# Patient Record
Sex: Male | Born: 1995 | Race: Black or African American | Hispanic: No | Marital: Single | Smoking: Never smoker
Health system: Southern US, Community
[De-identification: ages and names within clinical notes are randomized; demographics above are authoritative.]

## PROBLEM LIST (undated history)

## (undated) DIAGNOSIS — L709 Acne, unspecified: Secondary | ICD-10-CM

## (undated) DIAGNOSIS — J309 Allergic rhinitis, unspecified: Secondary | ICD-10-CM

## (undated) HISTORY — DX: Allergic rhinitis, unspecified: J30.9

## (undated) HISTORY — DX: Acne, unspecified: L70.9

---
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
## 2014-05-04 ENCOUNTER — Ambulatory Visit: Payer: BC Managed Care – PPO | Admitting: Internal Medicine

## 2015-01-04 ENCOUNTER — Emergency Department (HOSPITAL_COMMUNITY): Payer: BLUE CROSS/BLUE SHIELD

## 2015-01-04 ENCOUNTER — Emergency Department (HOSPITAL_COMMUNITY)
Admission: EM | Admit: 2015-01-04 | Discharge: 2015-01-04 | Disposition: A | Discharge status: Home / Self Care | Payer: BLUE CROSS/BLUE SHIELD | Attending: Emergency Medicine | Admitting: Emergency Medicine

## 2015-01-04 DIAGNOSIS — Y906 Blood alcohol level of 120-199 mg/100 ml: Secondary | ICD-10-CM | POA: Insufficient documentation

## 2015-01-04 DIAGNOSIS — Y9389 Activity, other specified: Secondary | ICD-10-CM | POA: Insufficient documentation

## 2015-01-04 DIAGNOSIS — R1110 Vomiting, unspecified: Secondary | ICD-10-CM | POA: Insufficient documentation

## 2015-01-04 DIAGNOSIS — Z8709 Personal history of other diseases of the respiratory system: Secondary | ICD-10-CM | POA: Insufficient documentation

## 2015-01-04 DIAGNOSIS — F10120 Alcohol abuse with intoxication, uncomplicated: Secondary | ICD-10-CM | POA: Insufficient documentation

## 2015-01-04 DIAGNOSIS — Z7951 Long term (current) use of inhaled steroids: Secondary | ICD-10-CM | POA: Insufficient documentation

## 2015-01-04 DIAGNOSIS — Z041 Encounter for examination and observation following transport accident: Secondary | ICD-10-CM | POA: Insufficient documentation

## 2015-01-04 DIAGNOSIS — Y92410 Unspecified street and highway as the place of occurrence of the external cause: Secondary | ICD-10-CM | POA: Diagnosis not present

## 2015-01-04 DIAGNOSIS — F10920 Alcohol use, unspecified with intoxication, uncomplicated: Secondary | ICD-10-CM

## 2015-01-04 DIAGNOSIS — Y998 Other external cause status: Secondary | ICD-10-CM | POA: Insufficient documentation

## 2015-01-04 LAB — CBC
HCT: 37.8 % — ABNORMAL LOW (ref 39.0–52.0)
HEMOGLOBIN: 12.7 g/dL — AB (ref 13.0–17.0)
MCH: 26.7 pg (ref 26.0–34.0)
MCHC: 33.6 g/dL (ref 30.0–36.0)
MCV: 79.4 fL (ref 78.0–100.0)
PLATELETS: 257 10*3/uL (ref 150–400)
RBC: 4.76 MIL/uL (ref 4.22–5.81)
RDW: 16.6 % — ABNORMAL HIGH (ref 11.5–15.5)
WBC: 7 10*3/uL (ref 4.0–10.5)

## 2015-01-04 LAB — PROTIME-INR
INR: 1.15 (ref 0.00–1.49)
Prothrombin Time: 14.9 seconds (ref 11.6–15.2)

## 2015-01-04 LAB — COMPREHENSIVE METABOLIC PANEL
ALT: 18 U/L (ref 0–53)
ANION GAP: 9 (ref 5–15)
AST: 44 U/L — ABNORMAL HIGH (ref 0–37)
Albumin: 4.2 g/dL (ref 3.5–5.2)
Alkaline Phosphatase: 149 U/L — ABNORMAL HIGH (ref 39–117)
BILIRUBIN TOTAL: 1 mg/dL (ref 0.3–1.2)
BUN: 10 mg/dL (ref 6–23)
CO2: 24 mmol/L (ref 19–32)
Calcium: 8.6 mg/dL (ref 8.4–10.5)
Chloride: 108 mmol/L (ref 96–112)
Creatinine, Ser: 1.18 mg/dL (ref 0.50–1.35)
GFR calc non Af Amer: 88 mL/min — ABNORMAL LOW (ref >90)
Glucose, Bld: 121 mg/dL — ABNORMAL HIGH (ref 70–99)
POTASSIUM: 3.6 mmol/L (ref 3.5–5.1)
SODIUM: 141 mmol/L (ref 135–145)
Total Protein: 7 g/dL (ref 6.0–8.3)

## 2015-01-04 LAB — SAMPLE TO BLOOD BANK

## 2015-01-04 LAB — ETHANOL: Alcohol, Ethyl (B): 163 mg/dL — ABNORMAL HIGH (ref 0–9)

## 2015-01-04 MED ORDER — SODIUM CHLORIDE 0.9 % IV BOLUS (SEPSIS)
1000.0000 mL | Freq: Once | INTRAVENOUS | Status: AC
  Administered 2015-01-04: 1000 mL via INTRAVENOUS
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
## 2015-01-04 NOTE — ED Notes (Signed)
Pt awake, removed c-collar.  Instructed pt on need to remain in collar and replaced.

## 2015-01-04 NOTE — ED Notes (Signed)
Pt ambulated to the bathroom without any difficulty. Dr. Anitra LauthPlunkett aware.

## 2015-01-04 NOTE — Discharge Instructions (Signed)
Alcohol Intoxication °Alcohol intoxication occurs when you drink enough alcohol that it affects your ability to function. It can be mild or very severe. Drinking a lot of alcohol in a short time is called binge drinking. This can be very harmful. Drinking alcohol can also be more dangerous if you are taking medicines or other drugs. Some of the effects caused by alcohol may include: °· Loss of coordination. °· Changes in mood and behavior. °· Unclear thinking. °· Trouble talking (slurred speech). °· Throwing up (vomiting). °· Confusion. °· Slowed breathing. °· Twitching and shaking (seizures). °· Loss of consciousness. °HOME CARE °· Do not drive after drinking alcohol. °· Drink enough water and fluids to keep your pee (urine) clear or pale yellow. Avoid caffeine. °· Only take medicine as told by your doctor. °GET HELP IF: °· You throw up (vomit) many times. °· You do not feel better after a few days. °· You frequently have alcohol intoxication. Your doctor can help decide if you should see a substance use treatment counselor. °GET HELP RIGHT AWAY IF: °· You become shaky when you stop drinking. °· You have twitching and shaking. °· You throw up blood. It may look bright red or like coffee grounds. °· You notice blood in your poop (bowel movements). °· You become lightheaded or pass out (faint). °MAKE SURE YOU:  °· Understand these instructions. °· Will watch your condition. °· Will get help right away if you are not doing well or get worse. °Document Released: 03/16/2008 Document Revised: 05/31/2013 Document Reviewed: 03/03/2013 °ExitCare® Patient Information ©2015 [employer]. This information is not intended to replace advice given to you by your health care provider. Make sure you discuss any questions you have with your health care provider. ° °

## 2015-01-04 NOTE — ED Provider Notes (Signed)
Patient is now awake alert and oriented. He is able to ambulate without difficulty. C-spine cleared. Patient was discharged home.  Gwyneth SproutWhitney Rockelle Heuerman, MD 01/04/15 [id number]
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
## 2015-04-02 ENCOUNTER — Encounter: Payer: Self-pay | Admitting: Family Medicine

## 2015-04-02 ENCOUNTER — Ambulatory Visit (INDEPENDENT_AMBULATORY_CARE_PROVIDER_SITE_OTHER): Payer: BLUE CROSS/BLUE SHIELD | Admitting: Family Medicine

## 2015-04-02 VITALS — BP 116/70 | HR 68 | Temp 99.0°F | Ht 75.0 in | Wt 172.8 lb

## 2015-04-02 DIAGNOSIS — Z025 Encounter for examination for participation in sport: Secondary | ICD-10-CM

## 2015-04-02 DIAGNOSIS — Z0000 Encounter for general adult medical examination without abnormal findings: Secondary | ICD-10-CM

## 2015-04-02 DIAGNOSIS — L709 Acne, unspecified: Secondary | ICD-10-CM

## 2015-04-02 NOTE — Progress Notes (Signed)
Pre visit review using our clinic review tool, if applicable. No additional management support is needed unless otherwise documented below in the visit note.  Sports physical:  No complaints. Will need form done for band per patient report.  He'll drop it off. No CP, SOB, BLE edema.  No h/o syncope, no h/o cardiac abnormality.  Has participated throughout high school and college w/o troubles.   Routine safety d/w pt, etoh, hearing protection (band), etc.   ROS:  See HPI.  Otherwise negative.  No personal or family history of any disorder that would prevent athletic participation.  Meds, vitals, and allergies reviewed.   GEN: nad, alert and oriented HEENT: mucous membranes moist, tm wnl bilaterally  NECK: supple w/o LA CV: rrr.  no murmur PULM: ctab, no inc wob ABD: soft, +bs EXT: no edema SKIN: no acute rash  CN 2-12 wnl B, S/S/DTR wnl x4  no laxity of shoulders, elbows, knees

## 2015-04-02 NOTE — Patient Instructions (Signed)
Get me the forms from the band as soon as you can (at the latest by Friday) and I'll get them filled out.

## 2015-04-03 ENCOUNTER — Encounter: Payer: Self-pay | Admitting: Family Medicine

## 2015-04-03 DIAGNOSIS — Z0000 Encounter for general adult medical examination without abnormal findings: Secondary | ICD-10-CM | POA: Insufficient documentation

## 2015-04-03 DIAGNOSIS — L709 Acne, unspecified: Secondary | ICD-10-CM | POA: Insufficient documentation

## 2015-04-03 NOTE — Assessment & Plan Note (Signed)
See above, okay to participate.  Will await forms from patient.

## 2015-04-09 ENCOUNTER — Telehealth: Payer: Self-pay | Admitting: Internal Medicine

## 2015-04-09 NOTE — Telephone Encounter (Signed)
Pt dropped off forms that need to be filled out. Pt also stated that he thinks he may need vision exam.  Please call pt when ready to be picked up or let me know if vision screening needs to be scheduled.  Placing on Melanie's Desk, thanks

## 2015-04-09 NOTE — Telephone Encounter (Signed)
Patient's form is on Milanie Rosenfield's desk.  Form is complete except for needing the vision screen.  Patient is coming in on Wednesday to get that done and pick up the form.

## 2015-04-09 NOTE — Telephone Encounter (Signed)
Routed to wrong provider by mistake, sending to lugene

## 2015-04-10 NOTE — Telephone Encounter (Signed)
Taken care of by [place].
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
## 2016-04-21 ENCOUNTER — Ambulatory Visit (INDEPENDENT_AMBULATORY_CARE_PROVIDER_SITE_OTHER): Payer: BLUE CROSS/BLUE SHIELD | Admitting: Family Medicine

## 2016-04-21 ENCOUNTER — Encounter: Payer: Self-pay | Admitting: Family Medicine

## 2016-04-21 ENCOUNTER — Other Ambulatory Visit: Payer: Self-pay | Admitting: *Deleted

## 2016-04-21 VITALS — BP 112/76 | HR 65 | Temp 98.7°F | Wt 169.2 lb

## 2016-04-21 DIAGNOSIS — Z119 Encounter for screening for infectious and parasitic diseases, unspecified: Secondary | ICD-10-CM | POA: Diagnosis not present

## 2016-04-21 NOTE — Progress Notes (Signed)
Pre visit review using our clinic review tool, if applicable. No additional management support is needed unless otherwise documented below in the visit note.  Wants to get STD screening.   No sx, no discharge, pain, rash, etc.  In a new relationship, wanted to get tested first.  D/w pt about safer sex cautions.  Per patient, doing well in school.   Meds, vitals, and allergies reviewed.   ROS: Per HPI unless specifically indicated in ROS section   nad ncat Neck supple, no LA rrr ctab

## 2016-04-21 NOTE — Patient Instructions (Signed)
Go to the lab on the way out.  We'll contact you with your lab report. Take care.  Glad to see you.  

## 2016-04-22 DIAGNOSIS — Z0000 Encounter for general adult medical examination without abnormal findings: Secondary | ICD-10-CM | POA: Insufficient documentation

## 2016-04-22 LAB — RPR

## 2016-04-22 LAB — GC/CHLAMYDIA PROBE AMP
CT PROBE, AMP APTIMA: NOT DETECTED
GC Probe RNA: NOT DETECTED

## 2016-04-22 LAB — HIV ANTIBODY (ROUTINE TESTING W REFLEX): HIV 1&2 Ab, 4th Generation: NONREACTIVE

## 2016-04-22 NOTE — Assessment & Plan Note (Signed)
I encouraged patient re: safety, congratulated him on getting testing, see notes on labs.

## 2016-10-29 DIAGNOSIS — Z23 Encounter for immunization: Secondary | ICD-10-CM | POA: Diagnosis not present

## 2016-12-15 DIAGNOSIS — L700 Acne vulgaris: Secondary | ICD-10-CM | POA: Diagnosis not present

## 2016-12-15 DIAGNOSIS — Z5181 Encounter for therapeutic drug level monitoring: Secondary | ICD-10-CM | POA: Diagnosis not present

## 2016-12-15 DIAGNOSIS — Z23 Encounter for immunization: Secondary | ICD-10-CM | POA: Diagnosis not present

## 2017-04-26 ENCOUNTER — Telehealth: Payer: Self-pay | Admitting: *Deleted

## 2017-04-26 NOTE — Telephone Encounter (Signed)
-----   Message from Joaquim NamGraham S Duncan, MD sent at 04/26/2017 12:15 AM EDT ----- Regarding: call pt. doesn't need fasting labs prior to the visit.  We can do any labs if needed at the OV.  Thanks.  Clelia CroftShaw  ----- Message ----- From: Baldomero Lamyhavers, Nithya Meriweather C Sent: 04/21/2017   5:15 PM To: Joaquim NamGraham S Duncan, MD Subject: Cpx labs Tues 7/17, need orders. Thanks! :-)   Please order  future cpx labs for pt's upcoming lab appt. Thanks Rodney Boozeasha

## 2017-04-26 NOTE — Telephone Encounter (Signed)
Message left on home and cell # as authorized per DPR that I've canceled lab appt, and for pt to arrive fasting for cpx, that any labs will be done at cpx appt.

## 2017-04-27 ENCOUNTER — Other Ambulatory Visit: Payer: BLUE CROSS/BLUE SHIELD

## 2017-05-03 ENCOUNTER — Ambulatory Visit (INDEPENDENT_AMBULATORY_CARE_PROVIDER_SITE_OTHER): Payer: BLUE CROSS/BLUE SHIELD | Admitting: Family Medicine

## 2017-05-03 ENCOUNTER — Encounter: Payer: Self-pay | Admitting: Family Medicine

## 2017-05-03 VITALS — BP 116/76 | HR 57 | Temp 98.1°F | Ht 75.0 in | Wt 172.0 lb

## 2017-05-03 DIAGNOSIS — Z0000 Encounter for general adult medical examination without abnormal findings: Secondary | ICD-10-CM | POA: Diagnosis not present

## 2017-05-03 DIAGNOSIS — Z23 Encounter for immunization: Secondary | ICD-10-CM

## 2017-05-03 DIAGNOSIS — Z7189 Other specified counseling: Secondary | ICD-10-CM

## 2017-05-03 NOTE — Addendum Note (Signed)
Addended by: Annamarie MajorFUQUAY, LUGENE S on: 05/03/2017 09:52 AM   Modules accepted: Orders
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
## 2017-05-03 NOTE — Progress Notes (Signed)
CPE- See plan.  Routine anticipatory guidance given to patient.  See health maintenance.  The possibility exists that previously documented standard health maintenance information may have been brought forward from a previous encounter into this note.  If needed, that same information has been updated to reflect the current situation based on today's encounter.    Tetanus 2018 Flu shot prev done, encouraged for fall.   PNA and shingles not due, d/w  Pt Colon and prostate cancer screening  not due, d/w  Pt Living will d/w pt.  Mother and father designated if patient were incapacitated.   Safety d/w pt.   STD screening declined, d/w pt about safer sex.   Diet and exercise discussed.    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    PMH and SH reviewed  Meds, vitals, and allergies reviewed.   ROS: Per HPI.  Unless specifically indicated otherwise in HPI, the patient denies:  General: fever. Eyes: acute vision changes ENT: sore throat Cardiovascular: chest pain Respiratory: SOB GI: vomiting GU: dysuria Musculoskeletal: acute back pain Derm: acute rash Neuro: acute motor dysfunction Psych: worsening mood Endocrine: polydipsia Heme: bleeding Allergy: hayfever  GEN: nad, alert and oriented HEENT: mucous membranes moist NECK: supple w/o LA CV: rrr. PULM: ctab, no inc wob ABD: soft, +bs EXT: no edema SKIN: no acute rash

## 2017-05-03 NOTE — Assessment & Plan Note (Signed)
Tetanus 2018 Flu shot prev done, encouraged for fall.   PNA and shingles not due, d/w  Pt Colon and prostate cancer screening  not due, d/w  Pt Living will d/w pt.  Mother and father designated if patient were incapacitated.   Safety d/w pt.   STD screening declined, d/w pt about safer sex.   Diet and exercise discussed.

## 2017-05-03 NOTE — Patient Instructions (Addendum)
I would get a flu shot each fall.   Take care.  Glad to see you.  Update me as needed.  

## 2017-05-21 ENCOUNTER — Ambulatory Visit (INDEPENDENT_AMBULATORY_CARE_PROVIDER_SITE_OTHER): Payer: BLUE CROSS/BLUE SHIELD | Admitting: Family Medicine

## 2017-05-21 ENCOUNTER — Encounter: Payer: Self-pay | Admitting: Family Medicine

## 2017-05-21 DIAGNOSIS — R519 Headache, unspecified: Secondary | ICD-10-CM

## 2017-05-21 DIAGNOSIS — R51 Headache: Secondary | ICD-10-CM | POA: Diagnosis not present
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
## 2017-05-21 NOTE — Assessment & Plan Note (Signed)
Likely from neck muscle tightness, d/w pt about stretching, ibuprofen, pillow change, heat.  Update me as needed.  Wax in canal likely a separate issue, resolved with irritation.

## 2017-05-21 NOTE — Patient Instructions (Addendum)
Try a different pillow with ibuprofen and heat as needed for your neck.  Take care.  Glad to see you.  Update me as needed.

## 2017-10-09 DIAGNOSIS — Z23 Encounter for immunization: Secondary | ICD-10-CM | POA: Diagnosis not present

## 2018-08-25 ENCOUNTER — Ambulatory Visit: Payer: Self-pay | Admitting: Family Medicine

## 2018-08-25 ENCOUNTER — Encounter: Payer: Self-pay | Admitting: Family Medicine

## 2018-08-25 VITALS — BP 132/80 | HR 88 | Temp 98.2°F | Ht 75.0 in | Wt 174.0 lb

## 2018-08-25 DIAGNOSIS — R51 Headache: Secondary | ICD-10-CM | POA: Diagnosis not present

## 2018-08-25 DIAGNOSIS — R519 Headache, unspecified: Secondary | ICD-10-CM

## 2018-08-25 MED ORDER — METHOCARBAMOL 500 MG PO TABS: 500.0000 mg | ORAL_TABLET | Freq: Three times a day (TID) | ORAL | 1 refills | Status: DC | PRN

## 2018-08-25 NOTE — Patient Instructions (Signed)
Keep drinking water  Regular stretching will help  For a headache/neck pain episode- use heat 10 minutes at a time/ stretch neck/ ibuprofen (with food), drink fluids and try the methocarbamol (muscle relaxer) - caution of sedation   Continue to avoid caffeine  Think about regular exercise also  Try to get enough sleep   Update us if not improving soon/ if the medicine does not help   I will check in with Dr Para Marchuncan as well

## 2018-08-25 NOTE — Assessment & Plan Note (Signed)
Recurrent -most recent one lasting longer  Seems to stem from muscle spasm in L lower neck and move forward (to top of head/forehead but mostly the occiput) Disc poss of tension HA transforming to migraine (no other neuro symptoms) - but night time and early am occurrence support that  Also less likely occipital neuralgia Good sleep and lifestyle habits overall- handout given  Px methocarbamol to try for spasm  Also -enc to continue ibuprofen/ supportive pillow/heat  Will d/w PCP

## 2018-08-25 NOTE — Progress Notes (Signed)
Subjective:    Patient ID: Christian Harrell, male    DOB: 17-May-1996, 22 y.o.   MRN: [id number]  HPI Here for c/o of headache with neck pain   Wt Readings from Last 3 Encounters:  08/25/18 174 lb (78.9 kg)  05/21/17 171 lb 12 oz (77.9 kg)  05/03/17 172 lb (78 kg)   21.75 kg/m   This is worse than last visit   Wakes up in the middle of the night or am (wakes him up)  occ happens at work but less often   Right now -improved   Starts in L side of neck - tight feeling -then pain moves its way up to the head -to his forehead (back of skull hurts the worst)  Feels like a throbbing pain  Worst of it lasts 10-15 min - this am was different   1-2 hours  Pain scale- up to a 7-8/10 when it is worst  No nausea/dizziness or blurred vision   No fever  No recent illness  No head trauma  Stress level is not too bad  Not working out  Has inc water since headaches worsened  Avoids caffeine   sympt treatment- usually takes 2 advil  This am used a heating pad on neck and it helped a bit  Has a good pillow- well supported  No scalp tenderness   Neck otherwise does not bother him   Right now -just a little dull pain at base of skull   No hx of migraine in his family   Works 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 Headache 05/21/2017  . Advance care planning 05/03/2017  . Routine general medical examination at a health care facility 04/22/2016   Past Medical History:  Diagnosis Date  . Acne   . Allergic rhinitis 06/13/2013   Usually spring. OTC allergy meds for relief   History reviewed. No pertinent surgical history. Social History   Tobacco Use  . Smoking status: Never Smoker  . Smokeless tobacco: Never Used   Substance Use Topics  . Alcohol use: Yes    Comment: occ  . Drug use: No   Family History  Problem Relation Age of Onset  . Breast cancer Mother   . Hypertension Mother   . Stroke Maternal Grandmother    No Known Allergies Current Outpatient Medications on File Prior to Visit  Medication Sig Dispense Refill  . fluticasone (FLONASE) 50 MCG/ACT nasal spray Place 2 sprays into the nose daily. For the next month 16 g 1   No current facility-administered medications on file prior to visit.      Review of Systems  Constitutional: Negative for activity change, appetite change, fatigue, fever and unexpected weight change.  HENT: Negative for congestion, rhinorrhea, sore throat and trouble swallowing.   Eyes: Negative for pain, redness, itching and visual disturbance.  Respiratory: Negative for cough, chest tightness, shortness of breath and wheezing.   Cardiovascular: Negative for chest pain and palpitations.  Gastrointestinal: Negative for abdominal pain, blood in stool, constipation, diarrhea and nausea.  Endocrine: Negative for cold intolerance, heat intolerance, polydipsia and polyuria.  Genitourinary: Negative for difficulty urinating, dysuria, frequency and urgency.  Musculoskeletal: Positive for neck pain.  Negative for arthralgias, joint swelling and myalgias.  Skin: Negative for pallor and rash.  Neurological: Positive for headaches. Negative for dizziness, seizures, syncope, facial asymmetry, speech difficulty, weakness, light-headedness and numbness.  Hematological: Negative for adenopathy. Does not bruise/bleed easily.  Psychiatric/Behavioral: Negative for decreased concentration and dysphoric mood. The patient is not nervous/anxious.        Objective:   Physical Exam  Constitutional: He is oriented to person, place, and time. He appears well-developed and well-nourished. No distress.  Well appearing   HENT:  Head: Normocephalic and atraumatic.  Right Ear: External ear  normal.  Left Ear: External ear normal.  Nose: Nose normal.  Mouth/Throat: Oropharynx is clear and moist. No oropharyngeal exudate.  No sinus tenderness No temporal tenderness  No TMJ tenderness  Eyes: Pupils are equal, round, and reactive to light. Conjunctivae and EOM are normal. Right eye exhibits no discharge. Left eye exhibits no discharge. No scleral icterus.  No nystagmus  Neck: Normal range of motion and full passive range of motion without pain. Neck supple. No JVD present. Carotid bruit is not present. No tracheal deviation present. No thyromegaly present.  Tender L cervical and trapezius musculature / spasm is palpable  Mild tenderness of occiput w/o swelling or skin change   Cardiovascular: Normal rate, regular rhythm and normal heart sounds.  No murmur heard. Pulmonary/Chest: Effort normal and breath sounds normal. No respiratory distress. He has no wheezes. He has no rales.  Abdominal: Soft. Bowel sounds are normal. He exhibits no distension and no mass. There is no tenderness.  Musculoskeletal: He exhibits no edema or tenderness.  Lymphadenopathy:    He has no cervical adenopathy.  Neurological: He is alert and oriented to person, place, and time. He has normal strength and normal reflexes. He displays no atrophy, no tremor and normal reflexes. No cranial nerve deficit or sensory deficit. He exhibits normal muscle tone. He displays a negative Romberg sign. Coordination and gait normal.  No focal cerebellar signs   Skin: Skin is warm and dry. No rash noted. No pallor.  Psychiatric: He has a normal mood and affect. His behavior is normal. Thought content normal.  Pleasant / good mood           Assessment & Plan:   Problem List Items Addressed This Visit      Other   Headache - Primary    Recurrent -most recent one lasting longer  Seems to stem from muscle spasm in L lower neck and move forward (to top of head/forehead but mostly the occiput) Disc poss of tension HA  transforming to migraine (no other neuro symptoms) - but night time and early am occurrence support that  Also less likely occipital neuralgia Good sleep and lifestyle habits overall- handout given  Px methocarbamol to try for spasm  Also -enc to continue ibuprofen/ supportive pillow/heat  Will d/w PCP      Relevant Medications   methocarbamol (ROBAXIN) 500 MG tablet

## 2018-09-05 ENCOUNTER — Telehealth: Payer: Self-pay | Admitting: *Deleted

## 2018-09-05 NOTE — Telephone Encounter (Signed)
-----   Message from Joaquim NamGraham S Duncan, MD sent at 08/29/2018 12:08 AM EST -----  Check on patient later this week.  If the headaches are not getting better, then he needs f/u here.  Thanks.   Clelia CroftShaw   ----- Message ----- From: Joaquim Namuncan, Graham S, MD Sent: 08/29/2018 To: Joaquim NamGraham S Duncan, MD  Check on patient later this week.  If the headaches are not getting better, then he needs f/u here.  Thanks.  Clelia CroftShaw

## 2018-09-05 NOTE — Telephone Encounter (Signed)
Spoke with patient's Mom (Amye) who says the patient is not available and states that he has had maybe 2 more headaches since the OV here.  Mom was advised to tell the patient if the headaches are not getting better, to make an appointment for follow up.  Mom agrees.

## 2019-07-31 ENCOUNTER — Encounter: Payer: Self-pay | Admitting: Family Medicine

## 2019-07-31 ENCOUNTER — Other Ambulatory Visit: Payer: Self-pay

## 2019-07-31 ENCOUNTER — Ambulatory Visit (INDEPENDENT_AMBULATORY_CARE_PROVIDER_SITE_OTHER): Payer: BC Managed Care – PPO | Admitting: Family Medicine

## 2019-07-31 VITALS — BP 124/78 | HR 72 | Temp 97.6°F | Ht 75.0 in | Wt 169.5 lb

## 2019-07-31 DIAGNOSIS — Z0000 Encounter for general adult medical examination without abnormal findings: Secondary | ICD-10-CM | POA: Diagnosis not present

## 2019-07-31 DIAGNOSIS — Z7189 Other specified counseling: Secondary | ICD-10-CM

## 2019-07-31 NOTE — Progress Notes (Signed)
CPE- See plan.  Routine anticipatory guidance given to patient.  See health maintenance.  The possibility exists that previously documented standard health maintenance information may have been brought forward from a previous encounter into this note.  If needed, that same information has been updated to reflect the current situation based on today's encounter.    Tetanus 2018 Flu shot 2020 PNA and shingles not due, d/w Pt Colon and prostate cancer screening  not due, d/w  Pt Living will d/w pt.  Mother and Father designated if patient were incapacitated.   Safety d/w pt.   Diet and exercise discussed.    H/o tonsil stones noted episodically but not now.  D/w pt.    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  Graduated from college last May.    PMH and SH reviewed  Meds, vitals, and allergies reviewed.   ROS: Per HPI.  Unless specifically indicated otherwise in HPI, the patient denies:  General: fever. Eyes: acute vision changes ENT: sore throat Cardiovascular: chest pain Respiratory: SOB GI: vomiting GU: dysuria Musculoskeletal: acute back pain Derm: acute rash Neuro: acute motor dysfunction Psych: worsening mood Endocrine: polydipsia Heme: bleeding Allergy: hayfever  GEN: nad, alert and oriented HEENT: ncat, TM wnl B, OP wnl, no tonsil stones noted.  NECK: supple w/o LA CV: rrr. PULM: ctab, no inc wob ABD: soft, +bs EXT: no edema SKIN: no acute rash  Flu shot done at OV.

## 2019-07-31 NOTE — Assessment & Plan Note (Signed)
Living will d/w pt. Mother and Father designated if patient were incapacitated.  

## 2019-07-31 NOTE — Assessment & Plan Note (Signed)
Tetanus 2018 Flu shot 2020 PNA and shingles not due, d/w Pt Colon and prostate cancer screening  not due, d/w  Pt Living will d/w pt.  Mother and Father designated if patient were incapacitated.   Safety d/w pt.   Diet and exercise discussed.   H/o tonsil stones episodically.  D/w pt about gargling prn.  Update me as needed.

## 2019-07-31 NOTE — Patient Instructions (Signed)
Thanks for getting a flu shot done.  Keep working on diet and exercise.  I am glad work is going well.  Update me as needed.  Gargle with warm salt water as needed for tonsil stones.  Take care.  Glad to see you.

## 2020-08-01 ENCOUNTER — Ambulatory Visit (INDEPENDENT_AMBULATORY_CARE_PROVIDER_SITE_OTHER): Payer: BC Managed Care – PPO | Admitting: Family Medicine

## 2020-08-01 ENCOUNTER — Other Ambulatory Visit: Payer: Self-pay

## 2020-08-01 ENCOUNTER — Encounter: Payer: Self-pay | Admitting: Family Medicine

## 2020-08-01 VITALS — BP 120/78 | HR 74 | Temp 97.6°F | Ht 75.0 in | Wt 187.1 lb

## 2020-08-01 DIAGNOSIS — R739 Hyperglycemia, unspecified: Secondary | ICD-10-CM

## 2020-08-01 DIAGNOSIS — Z23 Encounter for immunization: Secondary | ICD-10-CM

## 2020-08-01 DIAGNOSIS — Z0000 Encounter for general adult medical examination without abnormal findings: Secondary | ICD-10-CM

## 2020-08-01 DIAGNOSIS — Z131 Encounter for screening for diabetes mellitus: Secondary | ICD-10-CM | POA: Insufficient documentation

## 2020-08-01 DIAGNOSIS — Z7189 Other specified counseling: Secondary | ICD-10-CM

## 2020-08-01 LAB — POCT GLYCOSYLATED HEMOGLOBIN (HGB A1C): Hemoglobin A1C: 5.2 % (ref 4.0–5.6)

## 2020-08-01 LAB — POCT CBG (FASTING - GLUCOSE)-MANUAL ENTRY: Glucose Fasting, POC: 105 mg/dL — AB (ref 70–99)

## 2020-08-01 NOTE — Progress Notes (Signed)
This visit occurred during the SARS-CoV-2 public health emergency.  Safety protocols were in place, including screening questions prior to the visit, additional usage of staff PPE, and extensive cleaning of exam room while observing appropriate contact time as indicated for disinfecting solutions.  CPE- See plan.  Routine anticipatory guidance given to patient.  See health maintenance.  The possibility exists that previously documented standard health maintenance information may have been brought forward from a previous encounter into this note.  If needed, that same information has been updated to reflect the current situation based on today's encounter.    Tetanus 2018 Flu shot 2021 PNA and shingles not due, d/w Pt Colon and prostate cancer screening not due, d/w Pt Living will d/w pt. Mother and Father designated if patient were incapacitated.  Safety d/w pt.  Diet and exercise discussed. Minimal increase in glucose noted.  Seen in follow-up A1c.  Work is going well.  Discussed.  PMH and SH reviewed  Meds, vitals, and allergies reviewed.   ROS: Per HPI.  Unless specifically indicated otherwise in HPI, the patient denies:  General: fever. Eyes: acute vision changes ENT: sore throat Cardiovascular: chest pain Respiratory: SOB GI: vomiting GU: dysuria Musculoskeletal: acute back pain Derm: acute rash Neuro: acute motor dysfunction Psych: worsening mood Endocrine: polydipsia Heme: bleeding Allergy: hayfever  GEN: nad, alert and oriented HEENT: ncat NECK: supple w/o LA CV: rrr. PULM: ctab, no inc wob ABD: soft, +bs EXT: no edema SKIN: no acute rash

## 2020-08-01 NOTE — Patient Instructions (Addendum)
Take care.  Glad to see you. Please update me as needed.

## 2020-08-04 NOTE — Assessment & Plan Note (Signed)
Living will d/w pt. Mother and Father designated if patient were incapacitated.

## 2020-08-04 NOTE — Assessment & Plan Note (Signed)
°  Tetanus 2018 Flu shot 2021 PNA and shingles not due, d/w Pt Colon and prostate cancer screening not due, d/w Pt Living will d/w pt. Mother and Father designated if patient were incapacitated.  Safety d/w pt.  Diet and exercise discussed. Minimal increase in glucose noted.  Seen in follow-up A1c.

## 2020-12-18 ENCOUNTER — Other Ambulatory Visit: Payer: Self-pay

## 2020-12-18 ENCOUNTER — Encounter: Payer: Self-pay | Admitting: Family Medicine

## 2020-12-18 ENCOUNTER — Ambulatory Visit: Payer: BC Managed Care – PPO | Admitting: Family Medicine

## 2020-12-18 VITALS — BP 118/62 | HR 79 | Temp 98.8°F | Ht 75.0 in | Wt 192.5 lb

## 2020-12-18 DIAGNOSIS — R3915 Urgency of urination: Secondary | ICD-10-CM

## 2020-12-18 LAB — POC URINALSYSI DIPSTICK (AUTOMATED)
Bilirubin, UA: NEGATIVE
Blood, UA: NEGATIVE
Glucose, UA: NEGATIVE
Ketones, UA: NEGATIVE
Leukocytes, UA: NEGATIVE
Nitrite, UA: NEGATIVE
Protein, UA: NEGATIVE
Spec Grav, UA: 1.02 (ref 1.010–1.025)
Urobilinogen, UA: 0.2 E.U./dL
pH, UA: 6 (ref 5.0–8.0)

## 2020-12-18 MED ORDER — SULFAMETHOXAZOLE-TRIMETHOPRIM 800-160 MG PO TABS: 1.0000 | ORAL_TABLET | Freq: Two times a day (BID) | ORAL | 0 refills | Status: AC

## 2020-12-18 NOTE — Progress Notes (Signed)
    Noal Abshier T. Nikko Goldwire, MD, CAQ Sports Medicine  Primary Care and Sports Medicine [facility] [address] [place], [address]  Phone: [phone]  FAX: [phone]  Christian Harrell - 25 y.o. male  MRN [id number]  Date of Birth: Jan 31, 1996  Date: 12/18/2020  PCP: Joaquim Nam, MD  Referral: Joaquim Nam, MD  Chief Complaint  Patient presents with  . Urinary Urgency    This visit occurred during the SARS-CoV-2 public health emergency.  Safety protocols were in place, including screening questions prior to the visit, additional usage of staff PPE, and extensive cleaning of exam room while observing appropriate contact time as indicated for disinfecting solutions.   Subjective:   Christian Harrell is a 25 y.o. very pleasant male patient with Body mass index is 24.06 kg/m. who presents with the following:  He is here with a question of possible UTI:  Felt tight in the stomach. No STD exposure at all and he has a steady partner  Eating ok and drinking ok Has been going on for a few weeks.  BM are normal  Hypogastric fullness  Review of Systems is noted in the HPI, as appropriate  Objective:   BP 118/62   Pulse 79   Temp 98.8 F (37.1 C) (Temporal)   Ht 6\' 3"  (1.905 m)   Wt 192 lb 8 oz (87.3 kg)   SpO2 97%   BMI 24.06 kg/m   GEN: No acute distress; alert,appropriate. PULM: Breathing comfortably in no respiratory distress PSYCH: Normally interactive.  ABD: S, NT, ND, + BS, No rebound, No HSM  GU: normal male, no rash or ulcer, nt testicles  Laboratory and Imaging Data: Results for orders placed or performed in visit on 12/18/20  POCT Urinalysis Dipstick (Automated)  Result Value Ref Range   Color, UA Yellow    Clarity, UA Clear    Glucose, UA Negative Negative   Bilirubin, UA Negative    Ketones, UA Negative    Spec Grav, UA 1.020 1.010 - 1.025   Blood, UA Negative    pH, UA 6.0 5.0 - 8.0   Protein, UA  Negative Negative   Urobilinogen, UA 0.2 0.2 or 1.0 E.U./dL   Nitrite, UA Negative    Leukocytes, UA Negative Negative     Assessment and Plan:     ICD-10-CM   1. Urinary urgency  R39.15 POCT Urinalysis Dipstick (Automated)   UA clear. Urethritis, nongonnococal? UTI with normal UA.  Will treat and await culture  Meds ordered this encounter  Medications  . sulfamethoxazole-trimethoprim (BACTRIM DS) 800-160 MG tablet    Sig: Take 1 tablet by mouth 2 (two) times daily for 7 days.    Dispense:  14 tablet    Refill:  0   There are no discontinued medications. Orders Placed This Encounter  Procedures  . POCT Urinalysis Dipstick (Automated)    Follow-up: No follow-ups on file.  Signed,  02/17/21. Dayden Viverette, MD   Outpatient Encounter Medications as of 12/18/2020  Medication Sig  . fluticasone (FLONASE) 50 MCG/ACT nasal spray Place 2 sprays into the nose daily. For the next month  . sulfamethoxazole-trimethoprim (BACTRIM DS) 800-160 MG tablet Take 1 tablet by mouth 2 (two) times daily for 7 days.   No facility-administered encounter medications on file as of 12/18/2020.

## 2020-12-18 NOTE — Addendum Note (Signed)
Addended by: Damita Lack on: 12/18/2020 02:29 PM   Modules accepted: Orders

## 2020-12-19 LAB — URINE CULTURE
MICRO NUMBER:: 11626918
Result:: NO GROWTH
SPECIMEN QUALITY:: ADEQUATE

## 2021-04-29 ENCOUNTER — Telehealth: Payer: Self-pay | Admitting: Family Medicine

## 2021-04-29 DIAGNOSIS — R519 Headache, unspecified: Secondary | ICD-10-CM

## 2021-04-29 NOTE — Telephone Encounter (Signed)
Mr. Nurse called in wanted to know if Dr. Para March can write a referral for neurology and the location is in [place]. Its for migraines  [facility] Address: [address], [place], [place] [address] Phone: [phone]

## 2021-04-30 NOTE — Telephone Encounter (Signed)
Patient aware referral was done. Patient is not having any new or emergent neurologic sx at this time.

## 2021-04-30 NOTE — Telephone Encounter (Signed)
I put in the referral.  Please check with patient to make sure he does not have any new/emergent neurologic symptoms.  Thanks.

## 2021-04-30 NOTE — Addendum Note (Signed)
Addended by: Joaquim Nam on: 04/30/2021 10:00 AM   Modules accepted: Orders

## 2021-06-05 DIAGNOSIS — G43709 Chronic migraine without aura, not intractable, without status migrainosus: Secondary | ICD-10-CM | POA: Diagnosis not present

## 2021-07-13 ENCOUNTER — Other Ambulatory Visit: Payer: Self-pay | Admitting: Family Medicine

## 2021-07-13 DIAGNOSIS — R739 Hyperglycemia, unspecified: Secondary | ICD-10-CM

## 2021-07-28 ENCOUNTER — Other Ambulatory Visit: Payer: BC Managed Care – PPO

## 2021-08-03 ENCOUNTER — Telehealth: Payer: Self-pay | Admitting: Family Medicine

## 2021-08-03 NOTE — Telephone Encounter (Signed)
Called patient's mother and discussed that I would be out of clinic on Monday due to an illness.  Please contact patient and reschedule when possible.  Thanks.

## 2021-08-04 ENCOUNTER — Encounter: Payer: BC Managed Care – PPO | Admitting: Family Medicine

## 2021-08-05 NOTE — Telephone Encounter (Signed)
[email or web address]

## 2021-08-13 DIAGNOSIS — G43709 Chronic migraine without aura, not intractable, without status migrainosus: Secondary | ICD-10-CM | POA: Diagnosis not present

## 2021-08-22 ENCOUNTER — Encounter: Payer: Self-pay | Admitting: Family Medicine

## 2021-08-22 ENCOUNTER — Ambulatory Visit (INDEPENDENT_AMBULATORY_CARE_PROVIDER_SITE_OTHER): Payer: BC Managed Care – PPO | Admitting: Family Medicine

## 2021-08-22 ENCOUNTER — Other Ambulatory Visit: Payer: Self-pay

## 2021-08-22 VITALS — BP 128/80 | HR 82 | Temp 98.0°F | Ht 75.0 in | Wt 188.0 lb

## 2021-08-22 DIAGNOSIS — Z0000 Encounter for general adult medical examination without abnormal findings: Secondary | ICD-10-CM | POA: Diagnosis not present

## 2021-08-22 DIAGNOSIS — R739 Hyperglycemia, unspecified: Secondary | ICD-10-CM | POA: Diagnosis not present

## 2021-08-22 DIAGNOSIS — Z23 Encounter for immunization: Secondary | ICD-10-CM | POA: Diagnosis not present

## 2021-08-22 DIAGNOSIS — Z7189 Other specified counseling: Secondary | ICD-10-CM

## 2021-08-22 DIAGNOSIS — R300 Dysuria: Secondary | ICD-10-CM

## 2021-08-22 LAB — BASIC METABOLIC PANEL
BUN: 14 mg/dL (ref 6–23)
CO2: 31 mEq/L (ref 19–32)
Calcium: 9.7 mg/dL (ref 8.4–10.5)
Chloride: 103 mEq/L (ref 96–112)
Creatinine, Ser: 1.15 mg/dL (ref 0.40–1.50)
GFR: 88.1 mL/min (ref >60.00)
Glucose, Bld: 89 mg/dL (ref 70–99)
Potassium: 4.3 mEq/L (ref 3.5–5.1)
Sodium: 139 mEq/L (ref 135–145)

## 2021-08-22 LAB — HEMOGLOBIN A1C: Hgb A1c MFr Bld: 5.6 % (ref 4.6–6.5)

## 2021-08-22 MED ORDER — FLUTICASONE PROPIONATE 50 MCG/ACT NA SUSP: 2.0000 | Freq: Every day | NASAL | Status: AC | PRN

## 2021-08-22 NOTE — Patient Instructions (Signed)
Thanks for getting a flu shot.  Keep working on diet and exercise.  Take care.  Glad to see you. Go to the lab on the way out.   If you have mychart we'll likely use that to update you.

## 2021-08-22 NOTE — Progress Notes (Signed)
This visit occurred during the SARS-CoV-2 public health emergency.  Safety protocols were in place, including screening questions prior to the visit, additional usage of staff PPE, and extensive cleaning of exam room while observing appropriate contact time as indicated for disinfecting solutions.  CPE- See plan.  Routine anticipatory guidance given to patient.  See health maintenance.  The possibility exists that previously documented standard health maintenance information may have been brought forward from a previous encounter into this note.  If needed, that same information has been updated to reflect the current situation based on today's encounter.    Tetanus 2018 Flu shot 2022 PNA and shingles not due, d/w pt Covid vaccine prev done.  Colon and prostate cancer screening  not due, d/w pt.  Living will d/w pt.  Mother and father designated if patient were incapacitated.   Diet and exercise discussed.  Working out at home.    He has episodic urinary urgency.  No burning with urination.  Not having symptoms every day.  No fevers, no chills.  No testile pain.  No discharge.  Drinks some caffeine, red bull, usually ~1 per day.  D/w pt about trying to taper caffeine and then update me is sx continue.  Prev u/s and ucx neg.  No change with abx prev.   D/w pt about rechecking sugar, see notes on labs.    H/o seasonal allergies, used flonase prn.  Usually with springtime sx.    Busy with work, enjoying that.    PMH and SH reviewed  Meds, vitals, and allergies reviewed.   ROS: Per HPI.  Unless specifically indicated otherwise in HPI, the patient denies:  General: fever. Eyes: acute vision changes ENT: sore throat Cardiovascular: chest pain Respiratory: SOB GI: vomiting GU: dysuria Musculoskeletal: acute back pain Derm: acute rash Neuro: acute motor dysfunction Psych: worsening mood Endocrine: polydipsia Heme: bleeding Allergy: hayfever  GEN: nad, alert and  oriented HEENT:ncat NECK: supple w/o LA CV: rrr. PULM: ctab, no inc wob ABD: soft, +bs, nontender to palpation. EXT: no edema SKIN: no acute rash

## 2021-08-24 DIAGNOSIS — R300 Dysuria: Secondary | ICD-10-CM | POA: Insufficient documentation

## 2021-08-24 NOTE — Assessment & Plan Note (Signed)
Tetanus 2018 Flu shot 2022 PNA and shingles not due, d/w pt Covid vaccine prev done.  Colon and prostate cancer screening  not due, d/w pt.  Living will d/w pt.  Mother and father designated if patient were incapacitated.   Diet and exercise discussed.  Working out at home.

## 2021-08-24 NOTE — Assessment & Plan Note (Signed)
Discussed with patient about cutting back on caffeine and then letting me know if his symptoms continue.  He agrees with plan.
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
## 2022-08-25 ENCOUNTER — Encounter: Payer: BC Managed Care – PPO | Admitting: Family Medicine

## 2022-08-31 DIAGNOSIS — G43709 Chronic migraine without aura, not intractable, without status migrainosus: Secondary | ICD-10-CM | POA: Diagnosis not present

## 2022-09-21 ENCOUNTER — Encounter: Payer: Self-pay | Admitting: Family Medicine

## 2022-09-21 ENCOUNTER — Ambulatory Visit (INDEPENDENT_AMBULATORY_CARE_PROVIDER_SITE_OTHER): Payer: BC Managed Care – PPO | Admitting: Family Medicine

## 2022-09-21 VITALS — BP 124/78 | HR 82 | Temp 97.6°F | Ht 75.0 in | Wt 184.0 lb

## 2022-09-21 DIAGNOSIS — Z23 Encounter for immunization: Secondary | ICD-10-CM

## 2022-09-21 DIAGNOSIS — Z7189 Other specified counseling: Secondary | ICD-10-CM

## 2022-09-21 DIAGNOSIS — Z0000 Encounter for general adult medical examination without abnormal findings: Secondary | ICD-10-CM

## 2022-09-21 NOTE — Assessment & Plan Note (Signed)
Living will d/w pt.  Mother and father designated if patient were incapacitated.

## 2022-09-21 NOTE — Patient Instructions (Signed)
Update me as needed.  Take care.  Glad to see you. Thanks for your effort.   

## 2022-09-21 NOTE — Progress Notes (Signed)
CPE- See plan.  Routine anticipatory guidance given to patient.  See health maintenance.  The possibility exists that previously documented standard health maintenance information may have been brought forward from a previous encounter into this note.  If needed, that same information has been updated to reflect the current situation based on today's encounter.    Tetanus 2018 Flu shot 2023 PNA and shingles not due, d/w pt Covid vaccine prev done.  Colon and prostate cancer screening  not due, d/w pt.  Living will d/w pt.  Mother and father designated if patient were incapacitated.   Diet and exercise discussed.  Working out at home.   HCV screening deferred, low risk.  He cut back on caffeine, urinary sx improved in the meantime.  No burning with urination.    H/o seasonal allergies, used flonase prn. Usually with springtime sx.   PMH and SH reviewed Meds, vitals, and allergies reviewed.   ROS: Per HPI.  Unless specifically indicated otherwise in HPI, the patient denies:  General: fever. Eyes: acute vision changes ENT: sore throat Cardiovascular: chest pain Respiratory: SOB GI: vomiting GU: dysuria Musculoskeletal: acute back pain Derm: acute rash Neuro: acute motor dysfunction Psych: worsening mood Endocrine: polydipsia Heme: bleeding Allergy: hayfever  GEN: nad, alert and oriented HEENT: ncat NECK: supple w/o LA CV: rrr. PULM: ctab, no inc wob ABD: soft, +bs EXT: no edema SKIN: no acute rash

## 2022-09-21 NOTE — Assessment & Plan Note (Signed)
Tetanus 2018 Flu shot 2023 PNA and shingles not due, d/w pt Covid vaccine prev done.  Colon and prostate cancer screening  not due, d/w pt.  Living will d/w pt.  Mother and father designated if patient were incapacitated.  Diet and exercise discussed.  Working out at home.   HCV screening deferred, low risk.

## 2023-09-12 ENCOUNTER — Other Ambulatory Visit: Payer: Self-pay | Admitting: Family Medicine

## 2023-09-12 DIAGNOSIS — Z131 Encounter for screening for diabetes mellitus: Secondary | ICD-10-CM

## 2023-09-17 ENCOUNTER — Other Ambulatory Visit (INDEPENDENT_AMBULATORY_CARE_PROVIDER_SITE_OTHER): Payer: BC Managed Care – PPO

## 2023-09-17 DIAGNOSIS — Z131 Encounter for screening for diabetes mellitus: Secondary | ICD-10-CM | POA: Diagnosis not present

## 2023-09-17 LAB — BASIC METABOLIC PANEL
BUN: 12 mg/dL (ref 6–23)
CO2: 29 meq/L (ref 19–32)
Calcium: 9.2 mg/dL (ref 8.4–10.5)
Chloride: 105 meq/L (ref 96–112)
Creatinine, Ser: 1.2 mg/dL (ref 0.40–1.50)
GFR: 82.51 mL/min (ref >60.00)
Glucose, Bld: 94 mg/dL (ref 70–99)
Potassium: 3.7 meq/L (ref 3.5–5.1)
Sodium: 141 meq/L (ref 135–145)

## 2023-09-24 ENCOUNTER — Encounter: Payer: Self-pay | Admitting: Family Medicine

## 2023-09-24 ENCOUNTER — Ambulatory Visit (INDEPENDENT_AMBULATORY_CARE_PROVIDER_SITE_OTHER): Payer: BC Managed Care – PPO | Admitting: Family Medicine

## 2023-09-24 VITALS — BP 128/82 | HR 68 | Temp 98.3°F | Ht 74.0 in | Wt 180.0 lb

## 2023-09-24 DIAGNOSIS — Z0000 Encounter for general adult medical examination without abnormal findings: Secondary | ICD-10-CM | POA: Diagnosis not present

## 2023-09-24 DIAGNOSIS — Z7189 Other specified counseling: Secondary | ICD-10-CM

## 2023-09-24 DIAGNOSIS — Z23 Encounter for immunization: Secondary | ICD-10-CM | POA: Diagnosis not present

## 2023-09-24 NOTE — Patient Instructions (Addendum)
Thanks for your effort.  Update me as needed.  Take care.  Glad to see you.  Try straight leg raises, rotated straight leg raises, and side leg lifts.  No weight try sets of 10 then gradually increase to sets of 20.

## 2023-09-24 NOTE — Progress Notes (Unsigned)
CPE- See plan.  Routine anticipatory guidance given to patient.  See health maintenance.  The possibility exists that previously documented standard health maintenance information may have been brought forward from a previous encounter into this note.  If needed, that same information has been updated to reflect the current situation based on today's encounter.    Tetanus 2018 Flu shot 2024 PNA and shingles not due, d/w pt Covid vaccine prev done.  HPV vaccine prev done.  Colon and prostate cancer screening  not due, d/w pt.  Living will d/w pt.  Mother and father designated if patient were incapacitated.  Diet and exercise discussed.  Working out at home.   HCV screening deferred, low risk.  He ran a half marathon recently.  D/w pt about hip abductor strengthening with relatively weak hip abductors on exam.   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  Some days at site, some days at office.    PMH and SH reviewed  Meds, vitals, and allergies reviewed.   ROS: Per HPI.  Unless specifically indicated otherwise in HPI, the patient denies:  General: fever. Eyes: acute vision changes ENT: sore throat Cardiovascular: chest pain Respiratory: SOB GI: vomiting GU: dysuria Musculoskeletal: acute back pain Derm: acute rash Neuro: acute motor dysfunction Psych: worsening mood Endocrine: polydipsia Heme: bleeding Allergy: hayfever  GEN: nad, alert and oriented HEENT: mucous membranes moist NECK: supple w/o LA CV: rrr. PULM: ctab, no inc wob ABD: soft, +bs EXT: no edema SKIN: no acute rash
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
## 2023-09-26 NOTE — Assessment & Plan Note (Addendum)
Tetanus 2018 Flu shot 2024 PNA and shingles not due, d/w pt Covid vaccine prev done.  HPV vaccine prev done.  Colon and prostate cancer screening  not due, d/w pt.  Living will d/w pt.  Mother and father designated if patient were incapacitated.  Diet and exercise discussed.  Working out at home.   HCV screening deferred, low risk.  Discussed with distance running it would be reasonable to try to strengthen the lateral compartment of his legs.  Discussed straight leg raises, externally rotated straight leg raises and side leg lifts without weight.  He can update me as needed.  He understood and agreed.

## 2024-01-07 ENCOUNTER — Ambulatory Visit: Admitting: Family Medicine

## 2024-01-07 ENCOUNTER — Encounter: Payer: Self-pay | Admitting: Family Medicine

## 2024-01-07 VITALS — BP 122/76 | HR 65 | Temp 98.7°F | Ht 74.0 in | Wt 183.0 lb

## 2024-01-07 DIAGNOSIS — Z113 Encounter for screening for infections with a predominantly sexual mode of transmission: Secondary | ICD-10-CM | POA: Diagnosis not present

## 2024-01-07 NOTE — Progress Notes (Signed)
 D/w pt about STD testing.  1 partner.  No burning with urination.  No rash.  No testicle pain.  No FCNAVD.  Routine cautions given to patient.  Meds, vitals, and allergies reviewed.   ROS: Per HPI unless specifically indicated in ROS section   Nad Ncat Neck supple, no lymphadenopathy. Rrr Ctab Skin well-perfused.

## 2024-01-07 NOTE — Patient Instructions (Signed)
 Go to the lab on the way out.   If you have mychart we'll likely use that to update you.    Take care.  Glad to see you.

## 2024-01-08 LAB — C. TRACHOMATIS/N. GONORRHOEAE RNA
C. trachomatis RNA, TMA: NOT DETECTED
N. gonorrhoeae RNA, TMA: NOT DETECTED

## 2024-01-08 LAB — HIV ANTIBODY (ROUTINE TESTING W REFLEX): HIV 1&2 Ab, 4th Generation: NONREACTIVE

## 2024-01-08 LAB — RPR: RPR Ser Ql: NONREACTIVE

## 2024-01-09 ENCOUNTER — Encounter: Payer: Self-pay | Admitting: Family Medicine

## 2024-01-09 DIAGNOSIS — Z113 Encounter for screening for infections with a predominantly sexual mode of transmission: Secondary | ICD-10-CM | POA: Insufficient documentation

## 2024-01-09 NOTE — Assessment & Plan Note (Signed)
See notes on labs.  Routine cautions given to patient. 

## 2024-09-10 ENCOUNTER — Other Ambulatory Visit: Payer: Self-pay | Admitting: Family Medicine

## 2024-09-10 DIAGNOSIS — Z131 Encounter for screening for diabetes mellitus: Secondary | ICD-10-CM

## 2024-09-18 ENCOUNTER — Other Ambulatory Visit: Payer: BC Managed Care – PPO

## 2024-09-18 DIAGNOSIS — Z131 Encounter for screening for diabetes mellitus: Secondary | ICD-10-CM | POA: Diagnosis not present

## 2024-09-18 LAB — BASIC METABOLIC PANEL WITH GFR
BUN: 13 mg/dL (ref 6–23)
CO2: 29 meq/L (ref 19–32)
Calcium: 9.5 mg/dL (ref 8.4–10.5)
Chloride: 102 meq/L (ref 96–112)
Creatinine, Ser: 1 mg/dL (ref 0.40–1.50)
GFR: 101.97 mL/min (ref >60.00)
Glucose, Bld: 99 mg/dL (ref 70–99)
Potassium: 3.5 meq/L (ref 3.5–5.1)
Sodium: 141 meq/L (ref 135–145)

## 2024-09-20 ENCOUNTER — Ambulatory Visit: Payer: Self-pay | Admitting: Family Medicine

## 2024-09-25 ENCOUNTER — Ambulatory Visit: Payer: BC Managed Care – PPO | Admitting: Family Medicine

## 2024-09-25 ENCOUNTER — Encounter: Payer: Self-pay | Admitting: Family Medicine

## 2024-09-25 VITALS — BP 122/62 | HR 67 | Temp 98.4°F | Ht 78.0 in | Wt 195.0 lb

## 2024-09-25 DIAGNOSIS — Z0000 Encounter for general adult medical examination without abnormal findings: Secondary | ICD-10-CM

## 2024-09-25 DIAGNOSIS — Z23 Encounter for immunization: Secondary | ICD-10-CM | POA: Diagnosis not present

## 2024-09-25 NOTE — Progress Notes (Signed)
 CPE- See plan.  Routine anticipatory guidance given to patient.  See health maintenance.  The possibility exists that previously documented standard health maintenance information may have been brought forward from a previous encounter into this note.  If needed, that same information has been updated to reflect the current situation based on today's encounter.     Tetanus 2018 Flu shot 2025 PNA and shingles not due, d/w pt Covid vaccine prev done.  HPV vaccine prev done.  Colon and prostate cancer screening  not due, d/w pt.  Living will d/w pt.  Mother and father designated if patient were incapacitated.  Diet and exercise discussed.  Working out at home.   HCV screening deferred, low risk.   He ran a full marathon recently, near [place].  It was a mix of terrain.  No injury.     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  Some days at site, some days at office.     PMH and SH reviewed   Meds, vitals, and allergies reviewed.    ROS: Per HPI.  Unless specifically indicated otherwise in HPI, the patient denies:   General: fever. Eyes: acute vision changes ENT: sore throat Cardiovascular: chest pain Respiratory: SOB GI: vomiting GU: dysuria Musculoskeletal: acute back pain Derm: acute rash Neuro: acute motor dysfunction Psych: worsening mood Endocrine: polydipsia Heme: bleeding Allergy: hayfever   GEN: nad, alert and oriented HEENT: mucous membranes moist NECK: supple w/o LA CV: rrr. PULM: ctab, no inc wob ABD: soft, +bs EXT: no edema SKIN: no acute rash

## 2024-09-25 NOTE — Patient Instructions (Signed)
 Flu shot today.  Update me as needed.  Take care.  Glad to see you. Thanks for your effort.

## 2024-09-25 NOTE — Assessment & Plan Note (Signed)
 Tetanus 2018 Flu shot 2025 PNA and shingles not due, d/w pt Covid vaccine prev done.  HPV vaccine prev done.  Colon and prostate cancer screening  not due, d/w pt.  Living will d/w pt.  Mother and father designated if patient were incapacitated.  Diet and exercise discussed.  Working out at home.   HCV screening deferred, low risk.   He ran a full marathon recently, near [place].  It was a mix of terrain.  No injury.     Still working with [facility] with holiday representative.  Some days at site, some days at office.    Doing well overall.  I asked him to update me as needed.  Recent labs d/w pt.

## 2025-09-21 ENCOUNTER — Other Ambulatory Visit

## 2025-09-28 ENCOUNTER — Encounter: Admitting: Family Medicine
